# Patient Record
Sex: Female | Born: 1970 | Hispanic: No | Marital: Single | Smoking: Light tobacco smoker
Health system: Southern US, Community
[De-identification: ages and names within clinical notes are randomized; demographics above are authoritative.]

## PROBLEM LIST (undated history)

## (undated) DIAGNOSIS — F329 Major depressive disorder, single episode, unspecified: Secondary | ICD-10-CM

## (undated) DIAGNOSIS — E538 Deficiency of other specified B group vitamins: Secondary | ICD-10-CM

## (undated) DIAGNOSIS — F32A Depression, unspecified: Secondary | ICD-10-CM

## (undated) DIAGNOSIS — Z8619 Personal history of other infectious and parasitic diseases: Secondary | ICD-10-CM

## (undated) DIAGNOSIS — D649 Anemia, unspecified: Secondary | ICD-10-CM

## (undated) DIAGNOSIS — R519 Headache, unspecified: Secondary | ICD-10-CM

## (undated) DIAGNOSIS — R51 Headache: Secondary | ICD-10-CM

## (undated) HISTORY — DX: Anemia, unspecified: D64.9

## (undated) HISTORY — DX: Headache: R51

## (undated) HISTORY — DX: Depression, unspecified: F32.A

## (undated) HISTORY — DX: Headache, unspecified: R51.9

## (undated) HISTORY — DX: Personal history of other infectious and parasitic diseases: Z86.19

## (undated) HISTORY — DX: Deficiency of other specified B group vitamins: E53.8

## (undated) HISTORY — DX: Major depressive disorder, single episode, unspecified: F32.9

---

## 1998-11-05 ENCOUNTER — Other Ambulatory Visit: Admission: RE | Admit: 1998-11-05 | Discharge: 1998-11-05 | Payer: Self-pay | Admitting: Obstetrics and Gynecology

## 2001-03-23 ENCOUNTER — Other Ambulatory Visit: Admission: RE | Admit: 2001-03-23 | Discharge: 2001-03-23 | Payer: Self-pay | Admitting: Obstetrics and Gynecology

## 2001-10-21 ENCOUNTER — Emergency Department (HOSPITAL_COMMUNITY): Admission: EM | Admit: 2001-10-21 | Discharge: 2001-10-21 | Payer: Self-pay | Admitting: Emergency Medicine

## 2001-10-21 ENCOUNTER — Encounter: Payer: Self-pay | Admitting: Emergency Medicine

## 2003-07-31 ENCOUNTER — Other Ambulatory Visit: Admission: RE | Admit: 2003-07-31 | Discharge: 2003-07-31 | Payer: Self-pay | Admitting: Obstetrics and Gynecology

## 2004-08-07 ENCOUNTER — Other Ambulatory Visit: Admission: RE | Admit: 2004-08-07 | Discharge: 2004-08-07 | Payer: Self-pay | Admitting: Obstetrics and Gynecology

## 2009-02-02 ENCOUNTER — Encounter: Admission: RE | Admit: 2009-02-02 | Discharge: 2009-02-02 | Payer: Self-pay | Admitting: Family Medicine

## 2010-05-21 ENCOUNTER — Inpatient Hospital Stay (HOSPITAL_COMMUNITY): Admission: AD | Admit: 2010-05-21 | Discharge: 2010-05-21 | Payer: Self-pay | Admitting: Obstetrics & Gynecology

## 2010-05-21 DIAGNOSIS — O209 Hemorrhage in early pregnancy, unspecified: Secondary | ICD-10-CM

## 2010-05-21 DIAGNOSIS — R109 Unspecified abdominal pain: Secondary | ICD-10-CM

## 2010-05-23 ENCOUNTER — Inpatient Hospital Stay (HOSPITAL_COMMUNITY): Admission: AD | Admit: 2010-05-23 | Discharge: 2010-05-23 | Payer: Self-pay | Admitting: Obstetrics & Gynecology

## 2010-05-23 ENCOUNTER — Ambulatory Visit: Payer: Self-pay | Admitting: Obstetrics and Gynecology

## 2010-11-07 LAB — CBC
HCT: 33.9 % — ABNORMAL LOW (ref 36.0–46.0)
Hemoglobin: 11.7 g/dL — ABNORMAL LOW (ref 12.0–15.0)
MCH: 35.8 pg — ABNORMAL HIGH (ref 26.0–34.0)
MCHC: 34.5 g/dL (ref 30.0–36.0)
MCV: 103.8 fL — ABNORMAL HIGH (ref 78.0–100.0)
Platelets: 254 10*3/uL (ref 150–400)
RBC: 3.27 MIL/uL — ABNORMAL LOW (ref 3.87–5.11)
RDW: 13.5 % (ref 11.5–15.5)
WBC: 6.1 10*3/uL (ref 4.0–10.5)

## 2010-11-07 LAB — GC/CHLAMYDIA PROBE AMP, GENITAL: GC Probe Amp, Genital: NEGATIVE

## 2010-11-07 LAB — ABO/RH: ABO/RH(D): A POS

## 2010-11-07 LAB — HCG, QUANTITATIVE, PREGNANCY
hCG, Beta Chain, Quant, S: 28 m[IU]/mL — ABNORMAL HIGH (ref <5)
hCG, Beta Chain, Quant, S: 3 m[IU]/mL (ref <5)

## 2012-04-30 ENCOUNTER — Other Ambulatory Visit: Payer: Self-pay | Admitting: Physician Assistant

## 2012-04-30 DIAGNOSIS — R109 Unspecified abdominal pain: Secondary | ICD-10-CM

## 2012-05-04 ENCOUNTER — Ambulatory Visit
Admission: RE | Admit: 2012-05-04 | Discharge: 2012-05-04 | Disposition: A | Discharge status: Home / Self Care | Payer: BC Managed Care – PPO | Source: Ambulatory Visit | Attending: Physician Assistant | Admitting: Physician Assistant

## 2012-05-04 DIAGNOSIS — R109 Unspecified abdominal pain: Secondary | ICD-10-CM

## 2013-04-14 ENCOUNTER — Other Ambulatory Visit: Payer: Self-pay | Admitting: Obstetrics and Gynecology

## 2013-04-14 DIAGNOSIS — R928 Other abnormal and inconclusive findings on diagnostic imaging of breast: Secondary | ICD-10-CM

## 2013-05-04 ENCOUNTER — Ambulatory Visit
Admission: RE | Admit: 2013-05-04 | Discharge: 2013-05-04 | Disposition: A | Discharge status: Home / Self Care | Payer: BC Managed Care – PPO | Source: Ambulatory Visit | Attending: Obstetrics and Gynecology | Admitting: Obstetrics and Gynecology

## 2013-05-04 DIAGNOSIS — R928 Other abnormal and inconclusive findings on diagnostic imaging of breast: Secondary | ICD-10-CM

## 2013-05-04 LAB — HM MAMMOGRAPHY

## 2014-04-12 LAB — HM MAMMOGRAPHY

## 2014-11-27 HISTORY — PX: COLPOSCOPY: SHX161

## 2015-04-16 LAB — HM MAMMOGRAPHY

## 2017-04-12 LAB — HM PAP SMEAR: HM Pap smear: POSITIVE

## 2017-12-08 ENCOUNTER — Ambulatory Visit: Payer: BC Managed Care – PPO | Admitting: Family Medicine

## 2017-12-21 ENCOUNTER — Other Ambulatory Visit (HOSPITAL_COMMUNITY)
Admission: RE | Admit: 2017-12-21 | Discharge: 2017-12-21 | Disposition: A | Discharge status: Home / Self Care | Payer: BC Managed Care – PPO | Source: Ambulatory Visit | Attending: Family Medicine | Admitting: Family Medicine

## 2017-12-21 ENCOUNTER — Encounter

## 2017-12-21 ENCOUNTER — Ambulatory Visit: Payer: BC Managed Care – PPO | Admitting: Family Medicine

## 2017-12-21 ENCOUNTER — Encounter: Payer: Self-pay | Admitting: Family Medicine

## 2017-12-21 VITALS — BP 128/80 | HR 76 | Temp 98.3°F | Ht 64.5 in | Wt 129.8 lb

## 2017-12-21 DIAGNOSIS — N760 Acute vaginitis: Secondary | ICD-10-CM | POA: Diagnosis not present

## 2017-12-21 DIAGNOSIS — B9689 Other specified bacterial agents as the cause of diseases classified elsewhere: Secondary | ICD-10-CM | POA: Insufficient documentation

## 2017-12-21 DIAGNOSIS — Z1239 Encounter for other screening for malignant neoplasm of breast: Secondary | ICD-10-CM

## 2017-12-21 DIAGNOSIS — Z23 Encounter for immunization: Secondary | ICD-10-CM | POA: Diagnosis not present

## 2017-12-21 DIAGNOSIS — Z8639 Personal history of other endocrine, nutritional and metabolic disease: Secondary | ICD-10-CM | POA: Insufficient documentation

## 2017-12-21 DIAGNOSIS — Z1231 Encounter for screening mammogram for malignant neoplasm of breast: Secondary | ICD-10-CM

## 2017-12-21 DIAGNOSIS — Z0000 Encounter for general adult medical examination without abnormal findings: Secondary | ICD-10-CM

## 2017-12-21 DIAGNOSIS — Z01419 Encounter for gynecological examination (general) (routine) without abnormal findings: Secondary | ICD-10-CM | POA: Insufficient documentation

## 2017-12-21 DIAGNOSIS — Z862 Personal history of diseases of the blood and blood-forming organs and certain disorders involving the immune mechanism: Secondary | ICD-10-CM | POA: Diagnosis not present

## 2017-12-21 LAB — CBC WITH DIFFERENTIAL/PLATELET
BASOS ABS: 0 10*3/uL (ref 0.0–0.1)
Basophils Relative: 0.8 % (ref 0.0–3.0)
EOS ABS: 0 10*3/uL (ref 0.0–0.7)
Eosinophils Relative: 0.5 % (ref 0.0–5.0)
HCT: 34 % — ABNORMAL LOW (ref 36.0–46.0)
HEMOGLOBIN: 11.6 g/dL — AB (ref 12.0–15.0)
LYMPHS ABS: 1.1 10*3/uL (ref 0.7–4.0)
Lymphocytes Relative: 24.1 % (ref 12.0–46.0)
MCHC: 34.2 g/dL (ref 30.0–36.0)
MCV: 105.6 fl — ABNORMAL HIGH (ref 78.0–100.0)
MONO ABS: 0.3 10*3/uL (ref 0.1–1.0)
Monocytes Relative: 6.7 % (ref 3.0–12.0)
NEUTROS PCT: 67.9 % (ref 43.0–77.0)
Neutro Abs: 3.2 10*3/uL (ref 1.4–7.7)
Platelets: 274 10*3/uL (ref 150.0–400.0)
RBC: 3.22 Mil/uL — AB (ref 3.87–5.11)
RDW: 13.7 % (ref 11.5–15.5)
WBC: 4.6 10*3/uL (ref 4.0–10.5)

## 2017-12-21 LAB — LIPID PANEL
CHOLESTEROL: 204 mg/dL — AB (ref 0–200)
HDL: 100.6 mg/dL (ref >39.00)
LDL Cholesterol: 87 mg/dL (ref 0–99)
NONHDL: 103.13
Total CHOL/HDL Ratio: 2
Triglycerides: 83 mg/dL (ref 0.0–149.0)
VLDL: 16.6 mg/dL (ref 0.0–40.0)

## 2017-12-21 LAB — TSH: TSH: 1.58 u[IU]/mL (ref 0.35–4.50)

## 2017-12-21 LAB — COMPREHENSIVE METABOLIC PANEL
ALBUMIN: 4.2 g/dL (ref 3.5–5.2)
ALK PHOS: 38 U/L — AB (ref 39–117)
ALT: 30 U/L (ref 0–35)
AST: 41 U/L — AB (ref 0–37)
BUN: 9 mg/dL (ref 6–23)
CO2: 31 mEq/L (ref 19–32)
CREATININE: 0.53 mg/dL (ref 0.40–1.20)
Calcium: 9 mg/dL (ref 8.4–10.5)
Chloride: 103 mEq/L (ref 96–112)
GFR: 132.1 mL/min (ref >60.00)
GLUCOSE: 82 mg/dL (ref 70–99)
Potassium: 4.1 mEq/L (ref 3.5–5.1)
SODIUM: 138 meq/L (ref 135–145)
TOTAL PROTEIN: 7.2 g/dL (ref 6.0–8.3)
Total Bilirubin: 0.3 mg/dL (ref 0.2–1.2)

## 2017-12-21 LAB — VITAMIN B12: VITAMIN B 12: 196 pg/mL — AB (ref 211–911)

## 2017-12-21 NOTE — Addendum Note (Signed)
Addended by: Dianne Dun on: 12/21/2017 11:43 AM   Modules accepted: Level of Service

## 2017-12-21 NOTE — Progress Notes (Signed)
Subjective:   Patient ID: Lisa Richardson, female    DOB: Nov 08, 1970, 46 y.o.   MRN: [id number]  Lisa Richardson is a pleasant 47 y.o. year old female who presents to clinic today with New Patient (Initial Visit) (Patient is here today to establish care. She has a H/O low vitamin B12 and she feels like it may be low again.  Also has been anemic in the past.) and Annual Exam (Patient is here for a CPE with PAP. She is currently fasting. She agrees to get the Tdap today as she is unsure when her last one was.  Her last Colonoscopy was 1997.  She has irregular periods and feels that she may be prenopausal.)  on 12/21/2017  HPI:  Health Maintenance  Topic Date Due  . HIV Screening  02/25/1987  . PAP SMEAR  02/24/1993  . INFLUENZA VACCINE  03/25/2018  . TETANUS/TDAP  12/22/2027   Due for pap smear and mammogram. No h/o abnormal pap smears. Period have become irregular- ? Perimenopausal.  Also has a h/o b12 deficiency- has been more fatigued. Would like this rechecked today.  Review of Systems  Constitutional: Positive for fatigue.  HENT: Negative.   Eyes: Negative.   Respiratory: Negative.   Cardiovascular: Negative.   Gastrointestinal: Negative.   Endocrine: Negative.   Genitourinary: Positive for menstrual problem. Negative for difficulty urinating, dyspareunia, dysuria, enuresis, flank pain and urgency.  Musculoskeletal: Negative.   Neurological: Negative.   Hematological: Negative.   Psychiatric/Behavioral: Negative.   All other systems reviewed and are negative.      Objective:    BP 128/80 (BP Location: Left Arm, Patient Position: Sitting, Cuff Size: Normal)   Pulse 76   Temp 98.3 F (36.8 C) (Oral)   Ht 5' 4.5" (1.638 m)   Wt 129 lb 12.8 oz (58.9 kg)   SpO2 98%   BMI 21.94 kg/m    Physical Exam    General:  Well-developed,well-nourished,in no acute distress; alert,appropriate and cooperative throughout examination Head:  normocephalic and atraumatic.   Eyes:   vision grossly intact, PERRL Ears:  R ear normal and L ear normal externally, TMs clear bilaterally Nose:  no external deformity.   Mouth:  good dentition.   Neck:  No deformities, masses, or tenderness noted. Breasts:  No mass, nodules, thickening, tenderness, bulging, retraction, inflamation, nipple discharge or skin changes noted.   Lungs:  Normal respiratory effort, chest expands symmetrically. Lungs are clear to auscultation, no crackles or wheezes. Heart:  Normal rate and regular rhythm. S1 and S2 normal without gallop, murmur, click, rub or other extra sounds. Abdomen:  Bowel sounds positive,abdomen soft and non-tender without masses, organomegaly or hernias noted. Rectal:  no external abnormalities.   Genitalia:  Pelvic Exam:        External: normal female genitalia without lesions or masses        Vagina: normal without lesions or masses        Cervix: normal without lesions or masses        Adnexa: normal bimanual exam without masses or fullness        Uterus: normal by palpation        Pap smear: performed Msk:  No deformity or scoliosis noted of thoracic or lumbar spine.   Extremities:  No clubbing, cyanosis, edema, or deformity noted with normal full range of motion of all joints.   Neurologic:  alert & oriented X3 and gait normal.   Skin:  Intact without suspicious lesions  or rashes Cervical Nodes:  No lymphadenopathy noted Axillary Nodes:  No palpable lymphadenopathy Psych:  Cognition and judgment appear intact. Alert and cooperative with normal attention span and concentration. No apparent delusions, illusions, hallucinations     Assessment & Plan:   Well woman exam with routine gynecological exam - Plan: Cytology - PAP  Need for Tdap vaccination - Plan: Tdap vaccine greater than or equal to 7yo IM No follow-ups on file.

## 2017-12-21 NOTE — Patient Instructions (Signed)
Great to meet you. I will call you with your lab results from today and you can view them online.   Please call the breast center at [phone] to schedule your mammogram.   

## 2017-12-21 NOTE — Assessment & Plan Note (Signed)
Reviewed preventive care protocols, scheduled due services, and updated immunizations Discussed nutrition, exercise, diet, and healthy lifestyle.  Pap smear done today. Mammogram ordered- pt to schedule.  Orders Placed This Encounter  Procedures  . MM Digital Screening  . Tdap vaccine greater than or equal to 46yo IM  . B12  . CBC with Differential/Platelet  . Comprehensive metabolic panel  . Lipid panel  . TSH

## 2017-12-22 LAB — CYTOLOGY - PAP
Adequacy: ABSENT
Bacterial vaginitis: POSITIVE — AB
CANDIDA VAGINITIS: NEGATIVE
Chlamydia: NEGATIVE
Diagnosis: NEGATIVE
HPV: NOT DETECTED
Neisseria Gonorrhea: NEGATIVE
TRICH (WINDOWPATH): NEGATIVE

## 2017-12-23 ENCOUNTER — Other Ambulatory Visit: Payer: Self-pay

## 2017-12-23 DIAGNOSIS — B9689 Other specified bacterial agents as the cause of diseases classified elsewhere: Secondary | ICD-10-CM

## 2017-12-23 DIAGNOSIS — N760 Acute vaginitis: Principal | ICD-10-CM

## 2017-12-23 MED ORDER — METRONIDAZOLE 500 MG PO TABS: 500.0000 mg | ORAL_TABLET | Freq: Two times a day (BID) | ORAL | 0 refills | Status: AC

## 2017-12-23 NOTE — Telephone Encounter (Signed)
Spoke with pt and discussed her lab results. Informed her of BV and told her Rx was sent to [facility] on [address]. TLG

## 2017-12-25 LAB — CERVICOVAGINAL ANCILLARY ONLY: HERPES (WINDOWPATH): NEGATIVE

## 2018-01-06 ENCOUNTER — Ambulatory Visit (INDEPENDENT_AMBULATORY_CARE_PROVIDER_SITE_OTHER): Payer: BC Managed Care – PPO | Admitting: *Deleted

## 2018-01-06 DIAGNOSIS — E538 Deficiency of other specified B group vitamins: Secondary | ICD-10-CM

## 2018-01-06 MED ORDER — CYANOCOBALAMIN 1000 MCG/ML IJ SOLN
1000.0000 ug | Freq: Once | INTRAMUSCULAR | Status: AC
  Administered 2018-01-06: 1000 ug via INTRAMUSCULAR

## 2018-01-06 NOTE — Progress Notes (Signed)
Patient received b12 injection, tolerated well.

## 2018-01-12 ENCOUNTER — Encounter: Payer: Self-pay | Admitting: Family Medicine

## 2018-01-12 NOTE — Progress Notes (Signed)
[facility]/thx dmf 

## 2018-01-29 ENCOUNTER — Encounter: Payer: Self-pay | Admitting: Radiology

## 2018-01-29 ENCOUNTER — Ambulatory Visit
Admission: RE | Admit: 2018-01-29 | Discharge: 2018-01-29 | Disposition: A | Discharge status: Home / Self Care | Payer: BC Managed Care – PPO | Source: Ambulatory Visit | Attending: Family Medicine | Admitting: Family Medicine

## 2018-01-29 DIAGNOSIS — Z1239 Encounter for other screening for malignant neoplasm of breast: Secondary | ICD-10-CM

## 2018-02-01 ENCOUNTER — Other Ambulatory Visit: Payer: Self-pay | Admitting: Family Medicine

## 2018-02-01 DIAGNOSIS — R928 Other abnormal and inconclusive findings on diagnostic imaging of breast: Secondary | ICD-10-CM

## 2018-02-03 ENCOUNTER — Other Ambulatory Visit: Payer: Self-pay | Admitting: Family Medicine

## 2018-02-03 ENCOUNTER — Ambulatory Visit
Admission: RE | Admit: 2018-02-03 | Discharge: 2018-02-03 | Disposition: A | Discharge status: Home / Self Care | Payer: BC Managed Care – PPO | Source: Ambulatory Visit | Attending: Family Medicine | Admitting: Family Medicine

## 2018-02-03 ENCOUNTER — Ambulatory Visit: Payer: BC Managed Care – PPO

## 2018-02-03 DIAGNOSIS — R921 Mammographic calcification found on diagnostic imaging of breast: Secondary | ICD-10-CM

## 2018-02-03 DIAGNOSIS — R928 Other abnormal and inconclusive findings on diagnostic imaging of breast: Secondary | ICD-10-CM

## 2018-02-04 ENCOUNTER — Ambulatory Visit
Admission: RE | Admit: 2018-02-04 | Discharge: 2018-02-04 | Disposition: A | Discharge status: Home / Self Care | Payer: BC Managed Care – PPO | Source: Ambulatory Visit | Attending: Family Medicine | Admitting: Family Medicine

## 2018-02-04 DIAGNOSIS — R921 Mammographic calcification found on diagnostic imaging of breast: Secondary | ICD-10-CM

## 2018-04-12 ENCOUNTER — Ambulatory Visit: Payer: BC Managed Care – PPO | Admitting: Family Medicine

## 2018-08-05 ENCOUNTER — Other Ambulatory Visit: Payer: Self-pay | Admitting: Family Medicine

## 2018-08-05 ENCOUNTER — Ambulatory Visit
Admission: RE | Admit: 2018-08-05 | Discharge: 2018-08-05 | Disposition: A | Discharge status: Home / Self Care | Payer: BC Managed Care – PPO | Source: Ambulatory Visit | Attending: Family Medicine | Admitting: Family Medicine

## 2018-08-05 DIAGNOSIS — R921 Mammographic calcification found on diagnostic imaging of breast: Secondary | ICD-10-CM

## 2018-12-23 ENCOUNTER — Encounter: Payer: BC Managed Care – PPO | Admitting: Family Medicine

## 2020-02-01 IMAGING — MG STEREOTACTIC VACUUM ASSIST RIGHT
8 of 15 series · 8 of 31 positions shown · non-contrast
Comparison: Previous exams.

ADDENDUM:
Pathology revealed FIBROCYSTIC CHANGES WITH ADENOSIS AND
CALCIFICATIONS of RIGHT breast, upper outer quadrant. This was found
to be concordant by Dr. Polin Billiot.

Pathology results were discussed with the patient by telephone. The
patient reported doing well after the biopsy with tenderness at the
site. Post biopsy instructions and care were reviewed and questions
were answered. The patient was encouraged to call The [REDACTED]
The patient was asked to return for bilateral diagnostic mammography
and possible ultrasound in 6 months and informed a reminder notice
would be sent regarding this appointment.
Pathology results reported by [facility]j Mon, RN on 02/05/2018.
CLINICAL DATA: 46-year-old female presenting for stereotactic
biopsy of 2 sites in the right breast. Only 1 of the sites persisted
at the time of biopsy, and so only 1 site was sampled.
EXAM:
RIGHT BREAST STEREOTACTIC CORE NEEDLE BIOPSY

[R (1 of 6)]
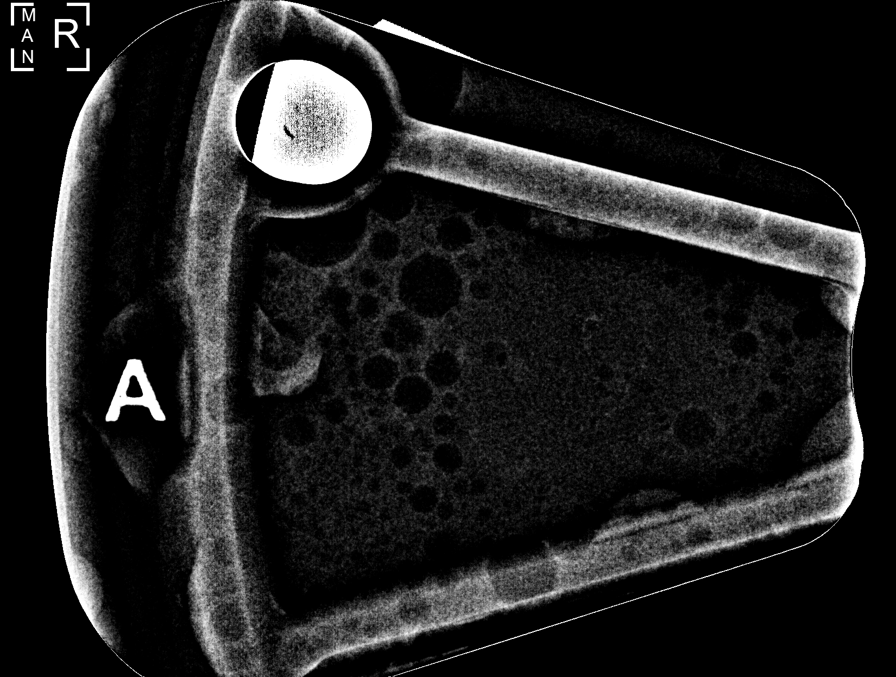

[R (2 of 6)]
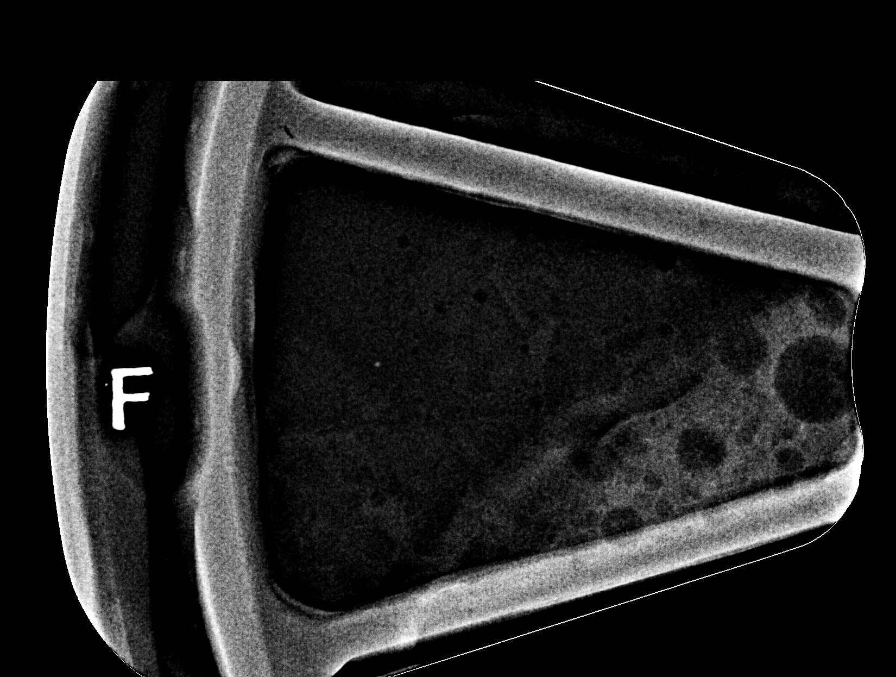

[R (3 of 6)]
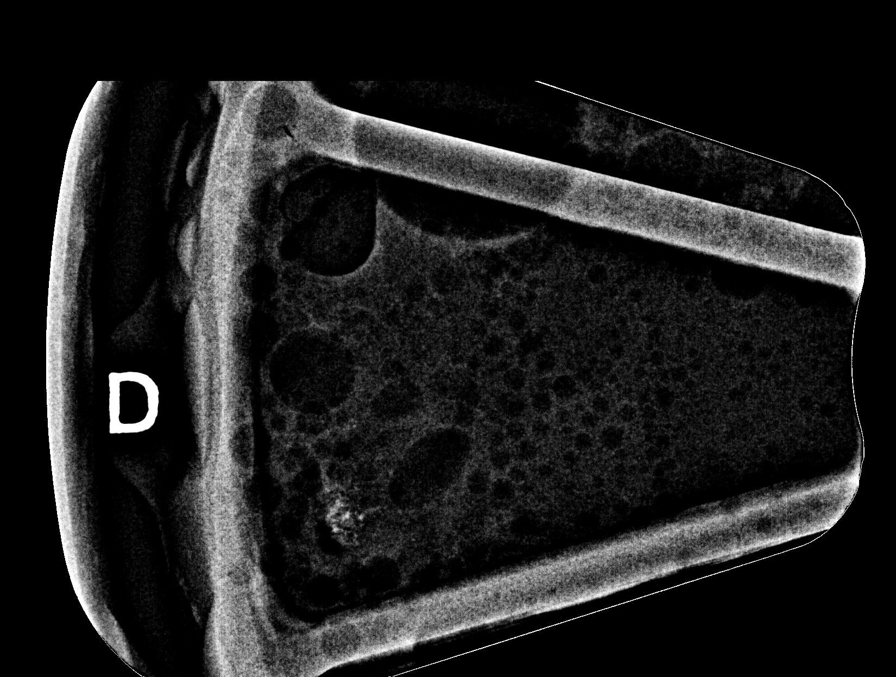

[R (4 of 6)]
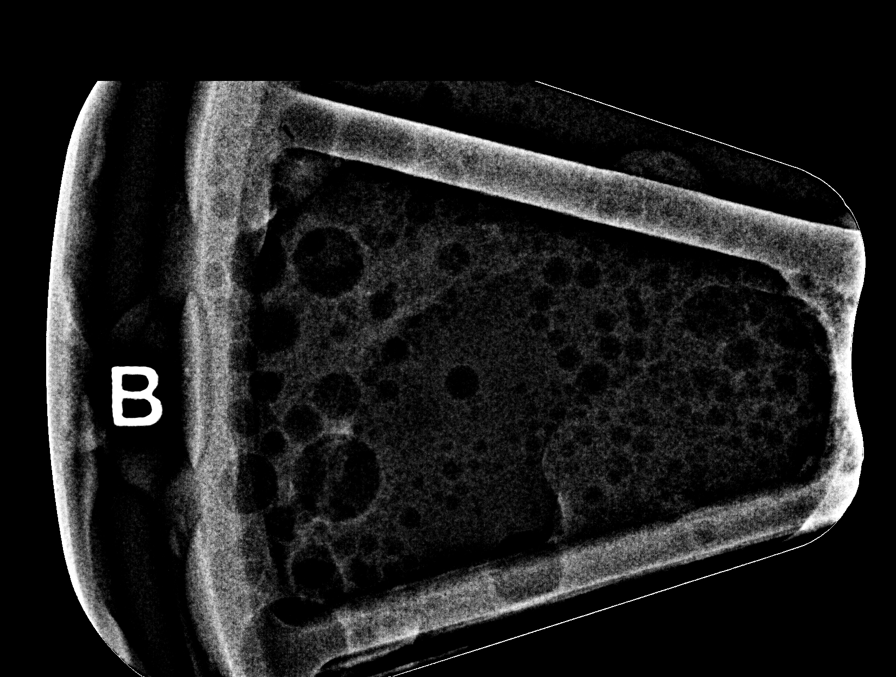

[R (5 of 6)]
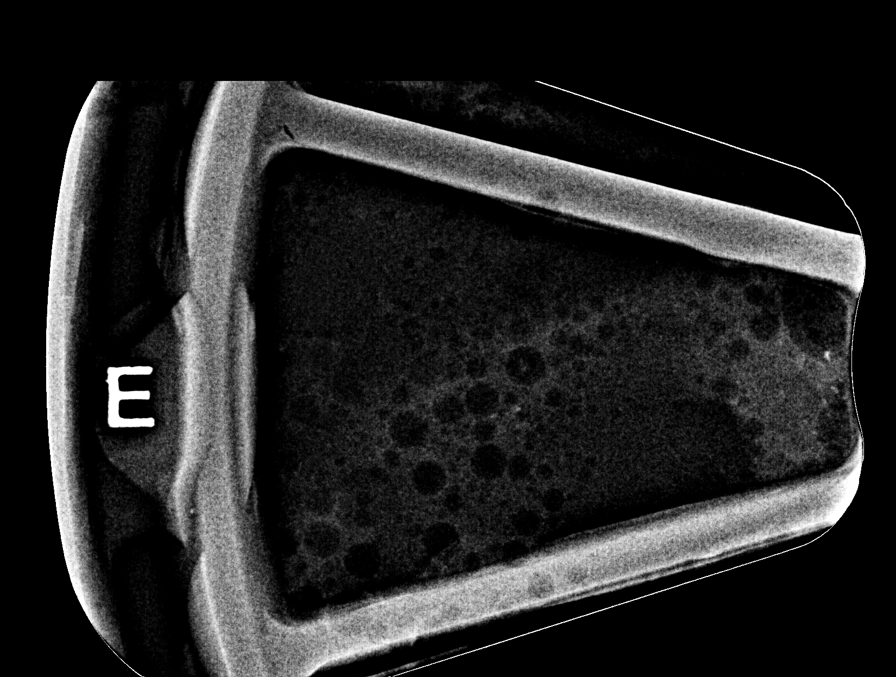

[R (6 of 6)]
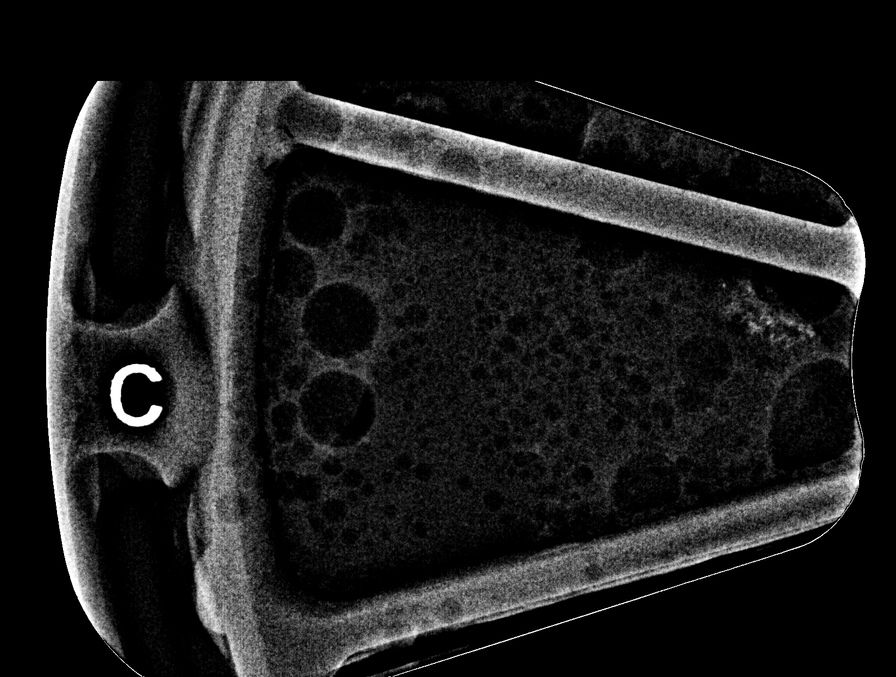

[R LM (1 of 2)]
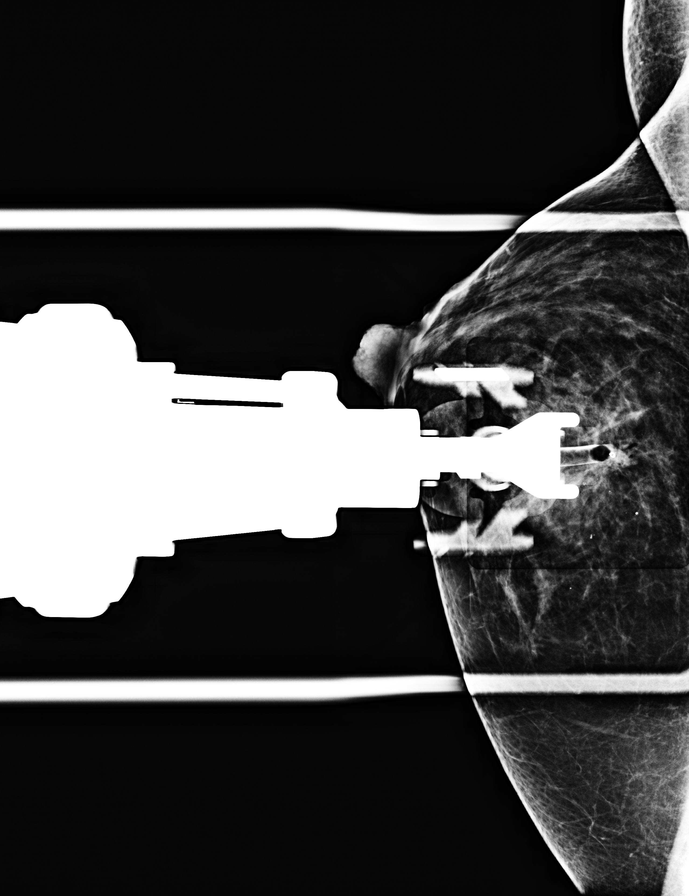

[R LM (2 of 2)]
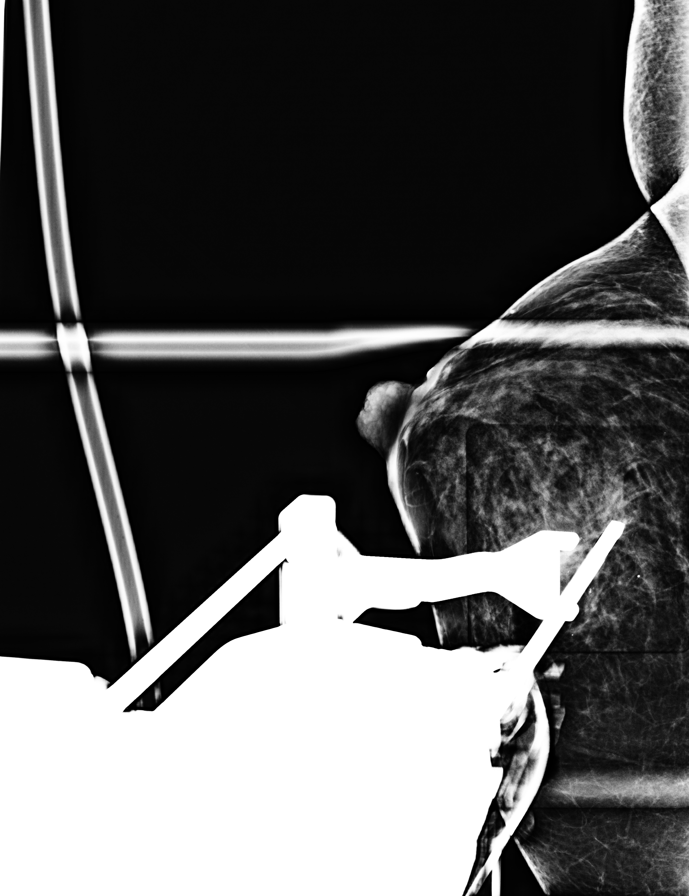

[8 of 31 positions shown; findings below may reference images not displayed]



Using sterile technique and 1% Lidocaine as local anesthetic, under
stereotactic guidance, a 9 gauge vacuum assisted device was used to
perform core needle biopsy of possible distortion with
calcifications in the upper-outer quadrant of the right breast using
a lateral approach. Specimen radiograph was performed showing
calcifications within multiple core samples. Specimens with
calcifications are identified for pathology.

Lesion quadrant: Upper-outer quadrant.

At the conclusion of the procedure, a coil shaped tissue marker clip
was deployed into the biopsy cavity. Follow-up 2-view mammogram was
performed and dictated separately.

The questioned distortion anterior to the site which was biopsied
did not persist at the time of biopsy. Additional spot compression
tomosynthesis images in the true lateral view were performed to
confirm that there is no persistent distortion in this location.
Therefore, only one biopsy was performed.
IMPRESSION: Stereotactic-guided biopsy of possible distortion with
calcifications in the upper-outer right breast. No apparent
complications.

## 2024-07-09 LAB — AMB RESULTS CONSOLE CBG: Glucose: 100

## 2024-08-08 LAB — AMB RESULTS CONSOLE CBG: Glucose: 100
